# Patient Record
Sex: Male | Born: 1965 | Race: White | Hispanic: No | Marital: Single
Health system: Southern US, Community
[De-identification: ages and names within clinical notes are randomized; demographics above are authoritative.]

---

## 2003-10-28 ENCOUNTER — Emergency Department (HOSPITAL_COMMUNITY): Admission: EM | Admit: 2003-10-28 | Discharge: 2003-10-29 | Payer: Self-pay | Admitting: Emergency Medicine

## 2013-04-26 ENCOUNTER — Other Ambulatory Visit: Payer: Self-pay | Admitting: Nurse Practitioner

## 2013-04-26 ENCOUNTER — Ambulatory Visit
Admission: RE | Admit: 2013-04-26 | Discharge: 2013-04-26 | Disposition: A | Discharge status: Home / Self Care | Payer: 59 | Source: Ambulatory Visit | Attending: Nurse Practitioner | Admitting: Nurse Practitioner

## 2013-04-26 DIAGNOSIS — J40 Bronchitis, not specified as acute or chronic: Secondary | ICD-10-CM

## 2013-05-25 ENCOUNTER — Other Ambulatory Visit: Payer: Self-pay | Admitting: Nurse Practitioner

## 2013-05-25 ENCOUNTER — Ambulatory Visit
Admission: RE | Admit: 2013-05-25 | Discharge: 2013-05-25 | Disposition: A | Discharge status: Home / Self Care | Payer: 59 | Source: Ambulatory Visit | Attending: Nurse Practitioner | Admitting: Nurse Practitioner

## 2013-05-25 DIAGNOSIS — J40 Bronchitis, not specified as acute or chronic: Secondary | ICD-10-CM

## 2013-06-28 ENCOUNTER — Ambulatory Visit
Admission: RE | Admit: 2013-06-28 | Discharge: 2013-06-28 | Disposition: A | Discharge status: Home / Self Care | Payer: 59 | Source: Ambulatory Visit | Attending: Internal Medicine | Admitting: Internal Medicine

## 2013-06-28 ENCOUNTER — Other Ambulatory Visit: Payer: Self-pay | Admitting: Internal Medicine

## 2013-06-28 DIAGNOSIS — J189 Pneumonia, unspecified organism: Secondary | ICD-10-CM

## 2015-01-30 ENCOUNTER — Other Ambulatory Visit: Payer: Self-pay | Admitting: Internal Medicine

## 2015-01-30 DIAGNOSIS — R1084 Generalized abdominal pain: Secondary | ICD-10-CM

## 2015-02-04 ENCOUNTER — Ambulatory Visit
Admission: RE | Admit: 2015-02-04 | Discharge: 2015-02-04 | Disposition: A | Discharge status: Home / Self Care | Payer: Self-pay | Source: Ambulatory Visit | Attending: Internal Medicine | Admitting: Internal Medicine

## 2015-02-04 DIAGNOSIS — R1084 Generalized abdominal pain: Secondary | ICD-10-CM

## 2016-05-18 ENCOUNTER — Other Ambulatory Visit: Payer: Self-pay | Admitting: Family Medicine

## 2016-05-18 DIAGNOSIS — M25531 Pain in right wrist: Secondary | ICD-10-CM

## 2016-05-20 ENCOUNTER — Ambulatory Visit
Admission: RE | Admit: 2016-05-20 | Discharge: 2016-05-20 | Disposition: A | Discharge status: Home / Self Care | Payer: 59 | Source: Ambulatory Visit | Attending: Family Medicine | Admitting: Family Medicine

## 2016-05-20 DIAGNOSIS — M25531 Pain in right wrist: Secondary | ICD-10-CM

## 2017-04-22 DIAGNOSIS — Z23 Encounter for immunization: Secondary | ICD-10-CM | POA: Diagnosis not present

## 2017-11-05 DIAGNOSIS — R7303 Prediabetes: Secondary | ICD-10-CM | POA: Diagnosis not present

## 2017-11-05 DIAGNOSIS — Z136 Encounter for screening for cardiovascular disorders: Secondary | ICD-10-CM | POA: Diagnosis not present

## 2017-11-05 DIAGNOSIS — Z0000 Encounter for general adult medical examination without abnormal findings: Secondary | ICD-10-CM | POA: Diagnosis not present

## 2017-11-18 DIAGNOSIS — E119 Type 2 diabetes mellitus without complications: Secondary | ICD-10-CM | POA: Diagnosis not present

## 2018-03-08 DIAGNOSIS — Z23 Encounter for immunization: Secondary | ICD-10-CM | POA: Diagnosis not present

## 2018-03-08 DIAGNOSIS — E1169 Type 2 diabetes mellitus with other specified complication: Secondary | ICD-10-CM | POA: Diagnosis not present

## 2018-04-08 IMAGING — CT CT WRIST*R* W/O CM
3 of 4 series · 9 of 33 positions shown, 11 images · non-contrast
Comparison: None.

CLINICAL DATA: Right wrist pain.  Fracture, nonhealing.

EXAM:
CT OF THE RIGHT WRIST WITHOUT CONTRAST
TECHNIQUE: Multidetector CT imaging of the right wrist was performed according
to the standard protocol. Multiplanar CT image reconstructions were
also generated.

[Series 5: thin soft · axial · 0.29mm/px · z∈[-53,-53]mm · 1 of 277 slices shown, 2 images]
[im 139/277  soft-tissue]
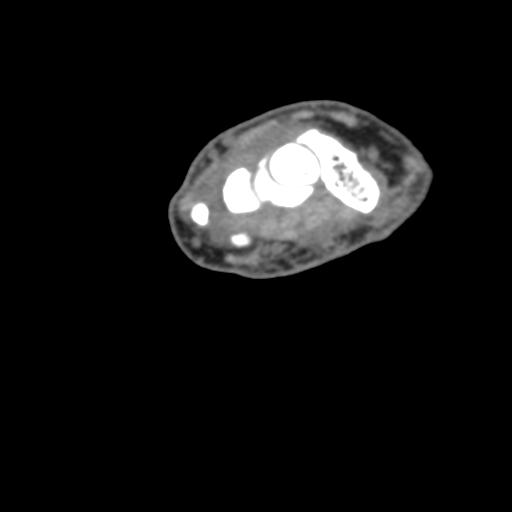
[im 139/277  bone]
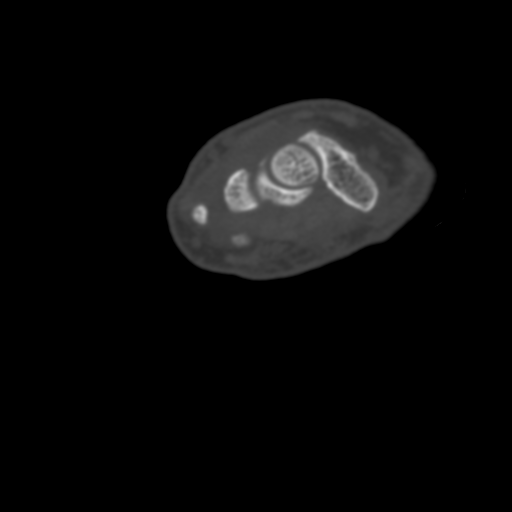

[Series 7: cor soft · coronal · 0.32mm/px · 3 of 37 slices shown]
[im 8/37  bone]
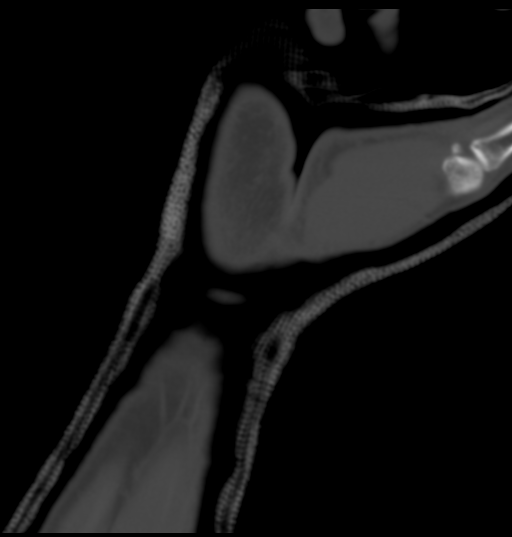
[im 15/37  bone]
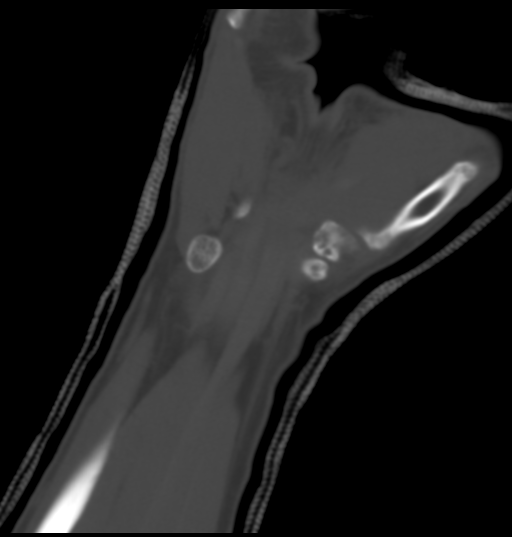
[im 22/37  bone]
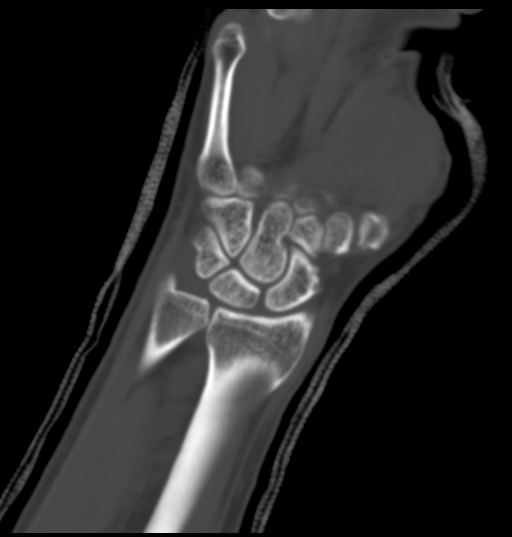

[Series 9: sag soft · sagittal · 0.14mm/px · 5 of 49 slices shown, 6 images]
[im 17/49  bone]
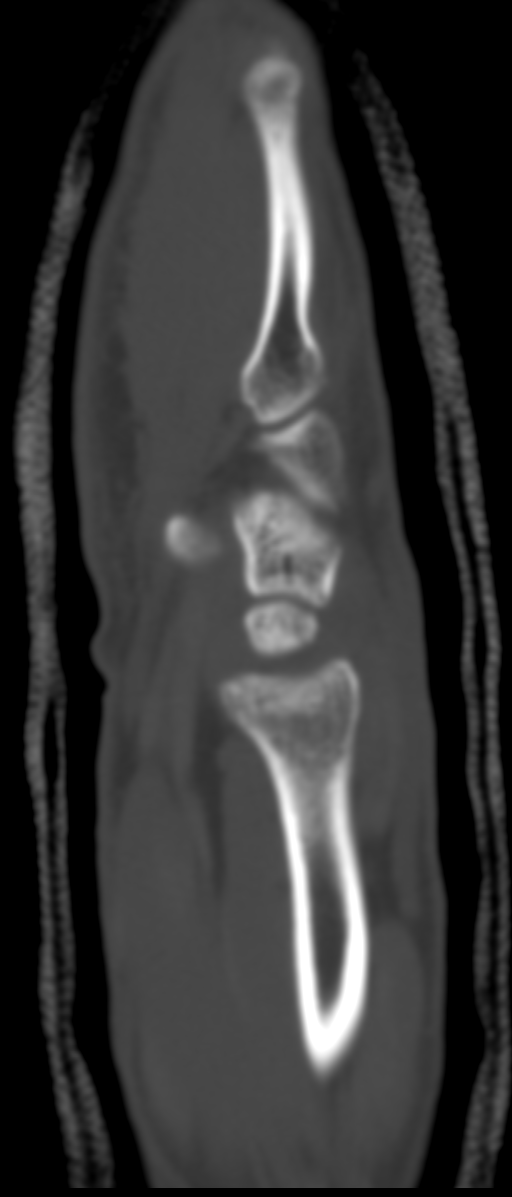
[im 21/49  bone]
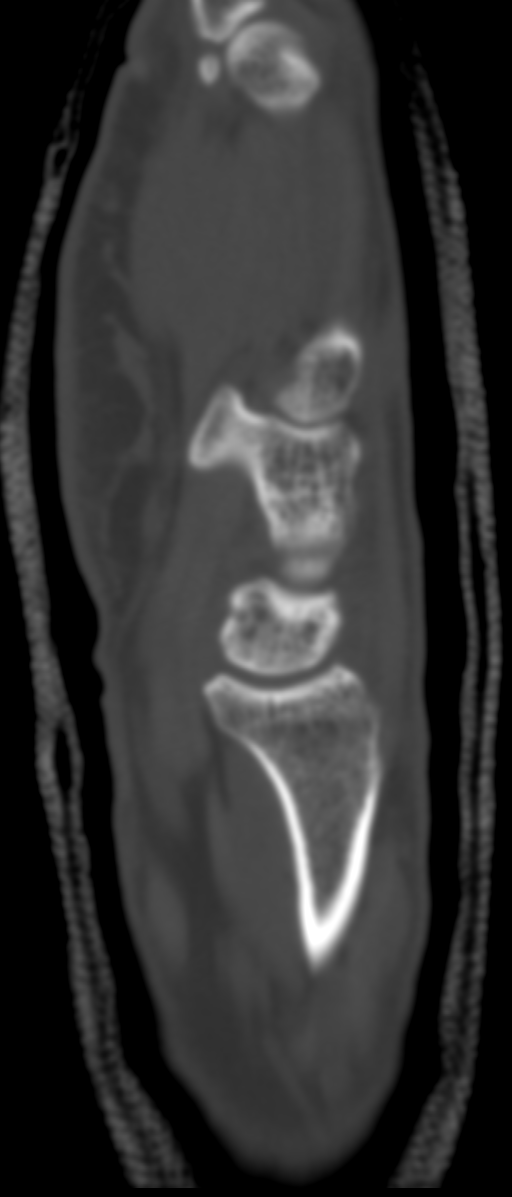
[im 25/49  soft-tissue]
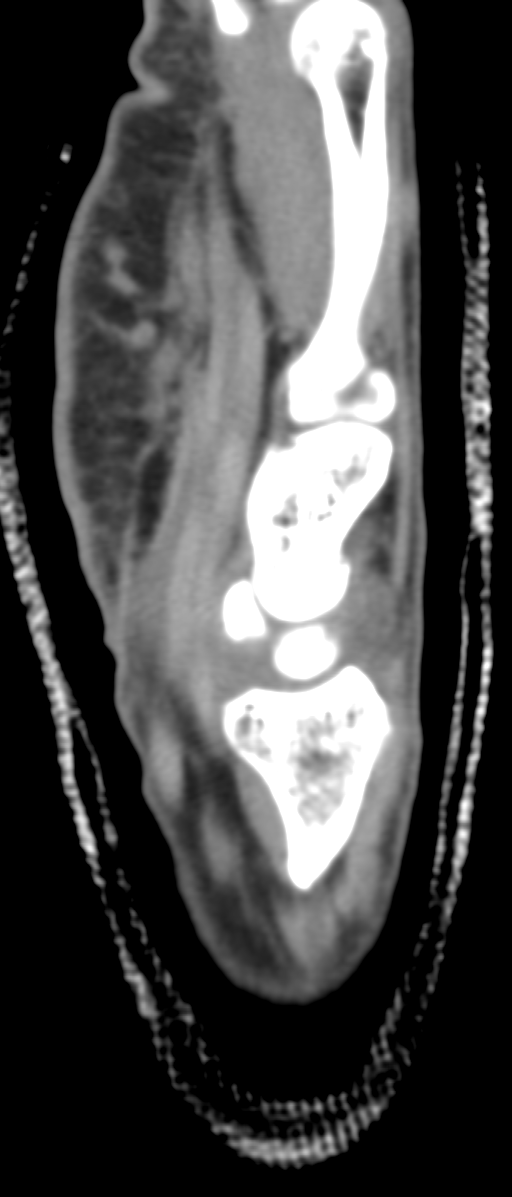
[im 25/49  bone]
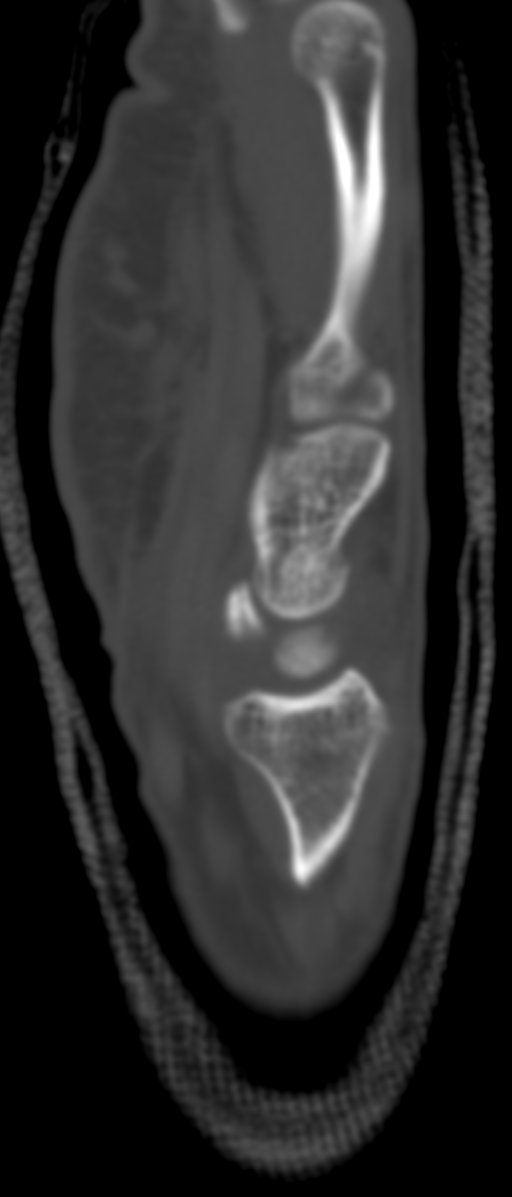
[im 29/49  bone]
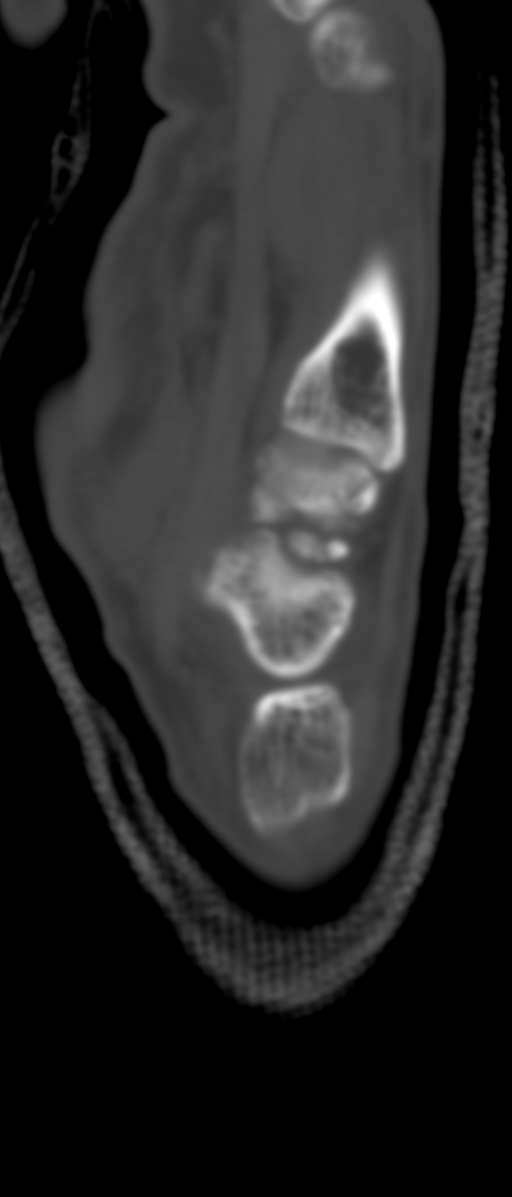
[im 33/49  bone]
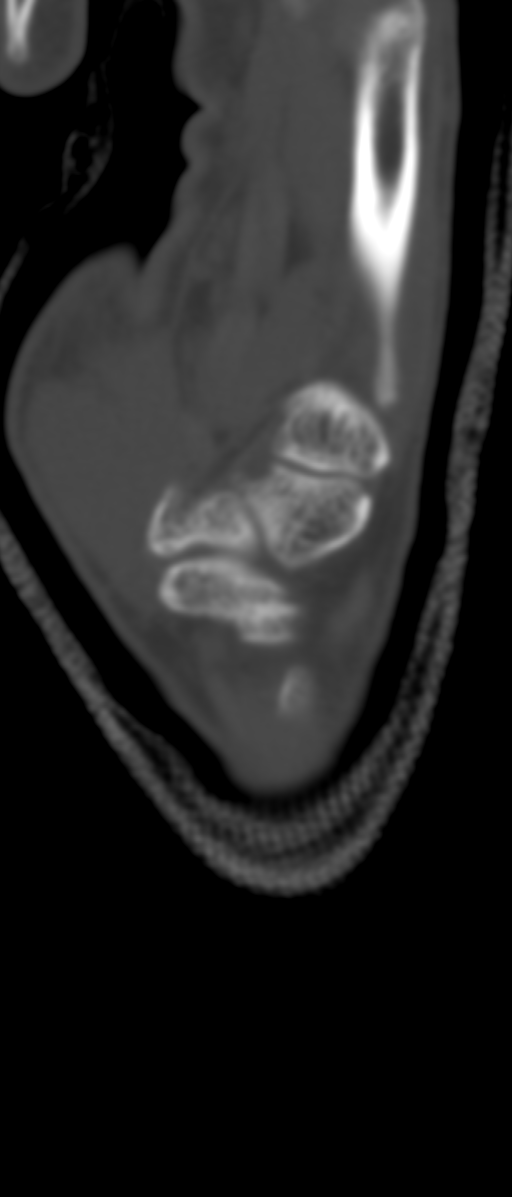

[9 of 33 positions shown; findings below may reference images not displayed]

FINDINGS: Bones/Joint/Cartilage

Along the distal pole of the scaphoid and interposed between the
scaphoid, trapezoid, and capitate, there are 2 small bony ossicles,
1 measuring 4 mm in long axis and the other 2.5 mm in long axis,
shown on image 66 through 67 of series 401 and also on images 24
through 27 of series 6. I am unsure which bone these are rose from,
although I favor the scaphoid. The scaphoid margin is currently
corticated as are the margins of the other carpal bones in this
vicinity.

Mild spurring at the first carpometacarpal articulation. Small
sclerotic lesion in the head of the index finger metacarpal, likely
a benign bone island.

Along the ulnar side of the base of the first metacarpal on image 18
series 6 there is a miniscule fleck of bone which is probably
incidental.

Ligaments

Suboptimally assessed by CT.

Muscles and Tendons

No discrete abnormality identified.

Soft tissues

Unremarkable
IMPRESSION: 1. Two small corticated bony fragments are present in the space
between the distal pole of the scaphoid, the trapezoid, in the
capitate. I would suspect that these represent small nonunited
fracture fragments from the scaphoid. No other fracture is observed.

## 2019-03-29 ENCOUNTER — Other Ambulatory Visit: Payer: Self-pay

## 2019-03-29 DIAGNOSIS — Z20822 Contact with and (suspected) exposure to covid-19: Secondary | ICD-10-CM

## 2019-03-30 LAB — NOVEL CORONAVIRUS, NAA: SARS-CoV-2, NAA: NOT DETECTED

## 2022-11-10 DIAGNOSIS — F4320 Adjustment disorder, unspecified: Secondary | ICD-10-CM | POA: Diagnosis not present

## 2022-11-27 DIAGNOSIS — F4320 Adjustment disorder, unspecified: Secondary | ICD-10-CM | POA: Diagnosis not present

## 2022-12-08 DIAGNOSIS — F4322 Adjustment disorder with anxiety: Secondary | ICD-10-CM | POA: Diagnosis not present

## 2022-12-23 DIAGNOSIS — F429 Obsessive-compulsive disorder, unspecified: Secondary | ICD-10-CM | POA: Diagnosis not present

## 2022-12-24 DIAGNOSIS — L03114 Cellulitis of left upper limb: Secondary | ICD-10-CM | POA: Diagnosis not present

## 2023-01-15 DIAGNOSIS — F4320 Adjustment disorder, unspecified: Secondary | ICD-10-CM | POA: Diagnosis not present

## 2023-01-29 DIAGNOSIS — F4320 Adjustment disorder, unspecified: Secondary | ICD-10-CM | POA: Diagnosis not present

## 2023-02-11 DIAGNOSIS — F4320 Adjustment disorder, unspecified: Secondary | ICD-10-CM | POA: Diagnosis not present

## 2023-02-24 DIAGNOSIS — E1165 Type 2 diabetes mellitus with hyperglycemia: Secondary | ICD-10-CM | POA: Diagnosis not present

## 2023-02-24 DIAGNOSIS — Z125 Encounter for screening for malignant neoplasm of prostate: Secondary | ICD-10-CM | POA: Diagnosis not present

## 2023-02-24 DIAGNOSIS — K760 Fatty (change of) liver, not elsewhere classified: Secondary | ICD-10-CM | POA: Diagnosis not present

## 2023-02-24 DIAGNOSIS — E785 Hyperlipidemia, unspecified: Secondary | ICD-10-CM | POA: Diagnosis not present

## 2023-02-24 DIAGNOSIS — Z0000 Encounter for general adult medical examination without abnormal findings: Secondary | ICD-10-CM | POA: Diagnosis not present

## 2023-02-24 DIAGNOSIS — Z13220 Encounter for screening for lipoid disorders: Secondary | ICD-10-CM | POA: Diagnosis not present

## 2023-02-24 DIAGNOSIS — Z23 Encounter for immunization: Secondary | ICD-10-CM | POA: Diagnosis not present

## 2023-02-24 DIAGNOSIS — J309 Allergic rhinitis, unspecified: Secondary | ICD-10-CM | POA: Diagnosis not present

## 2023-02-24 DIAGNOSIS — Z1389 Encounter for screening for other disorder: Secondary | ICD-10-CM | POA: Diagnosis not present

## 2023-02-25 DIAGNOSIS — F4322 Adjustment disorder with anxiety: Secondary | ICD-10-CM | POA: Diagnosis not present

## 2023-03-12 DIAGNOSIS — F4322 Adjustment disorder with anxiety: Secondary | ICD-10-CM | POA: Diagnosis not present

## 2023-04-02 DIAGNOSIS — F4322 Adjustment disorder with anxiety: Secondary | ICD-10-CM | POA: Diagnosis not present

## 2023-04-16 DIAGNOSIS — F4322 Adjustment disorder with anxiety: Secondary | ICD-10-CM | POA: Diagnosis not present

## 2023-08-01 DIAGNOSIS — R051 Acute cough: Secondary | ICD-10-CM | POA: Diagnosis not present

## 2023-08-01 DIAGNOSIS — R0981 Nasal congestion: Secondary | ICD-10-CM | POA: Diagnosis not present
